# Patient Record
Sex: Female | Born: 2005 | Race: Black or African American | Hispanic: No | Marital: Single | Smoking: Never smoker
Health system: Southern US, Community
[De-identification: ages and names within clinical notes are randomized; demographics above are authoritative.]

## PROBLEM LIST (undated history)

## (undated) DIAGNOSIS — J45909 Unspecified asthma, uncomplicated: Secondary | ICD-10-CM

## (undated) HISTORY — PX: NO PAST SURGERIES: SHX2092

---
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
## 2024-01-07 ENCOUNTER — Ambulatory Visit: Admitting: Obstetrics and Gynecology

## 2024-01-07 NOTE — Progress Notes (Deleted)
    GYNECOLOGY VISIT  Patient name: Anna Keith MRN [id number]  Date of birth: 26-Jun-2006 Chief Complaint:   No chief complaint on file.   History:  Anna Keith is a 18 y.o. G0P0000 being seen today for ***.    Seen by me 12/06/23: seen for AUB and acne. Switched to micronor  from slynd. VWF labs slightly abnormal, recommended repeat  Past Medical History:  Diagnosis Date   Asthma     Past Surgical History:  Procedure Laterality Date   NO PAST SURGERIES      The following portions of the patient's history were reviewed and updated as appropriate: allergies, current medications, past family history, past medical history, past social history, past surgical history and problem list.   Health Maintenance:   Last pap ***. Results were: {Pap findings:25134}. H/O abnormal pap: {yes/yes***/no:23866} Last mammogram: ***. Results were: {normal, abnormal, n/a:23837}. Family h/o breast cancer: {yes***/no:23838}   Review of Systems:  {Ros - complete:30496} Comprehensive review of systems was otherwise negative.   Objective:  Physical Exam There were no vitals taken for this visit.   Physical Exam   Labs and Imaging No results found.     Assessment & Plan:   There are no diagnoses linked to this encounter.   *** Routine preventative health maintenance measures emphasized.  Kiki Pelton, MD [facility] for [facility], [facility]

## 2024-01-16 ENCOUNTER — Ambulatory Visit: Admitting: Family Medicine

## 2024-03-07 ENCOUNTER — Telehealth: Admitting: Nurse Practitioner

## 2024-03-07 DIAGNOSIS — H66004 Acute suppurative otitis media without spontaneous rupture of ear drum, recurrent, right ear: Secondary | ICD-10-CM

## 2024-03-07 MED ORDER — CEFDINIR 300 MG PO CAPS: 300.0000 mg | ORAL_CAPSULE | Freq: Two times a day (BID) | ORAL | 0 refills | Status: AC

## 2024-03-07 MED ORDER — IBUPROFEN 600 MG PO TABS: 600.0000 mg | ORAL_TABLET | Freq: Three times a day (TID) | ORAL | 0 refills | Status: DC | PRN

## 2024-03-07 NOTE — Patient Instructions (Signed)
  Anna Keith, thank you for joining Haze LELON Servant, NP for today's virtual visit.  While this provider is not your primary care provider (PCP), if your PCP is located in our provider database this encounter information will be shared with them immediately following your visit.   A [facility] MyChart account gives you access to today's visit and all your visits, tests, and labs performed at [facility]  click here if you don't have a [facility] MyChart account or go to [email or web address]  Consent: (Patient) Anna Keith provided verbal consent for this virtual visit at the beginning of the encounter.  Current Medications:  Current Outpatient Medications:    cefdinir  (OMNICEF ) 300 MG capsule, Take 1 capsule (300 mg total) by mouth 2 (two) times daily for 7 days., Disp: 14 capsule, Rfl: 0   ibuprofen  (ADVIL ) 600 MG tablet, Take 1 tablet (600 mg total) by mouth every 8 (eight) hours as needed., Disp: 30 tablet, Rfl: 0   neomycin -polymyxin-hydrocortisone  (CORTISPORIN) OTIC solution, Place 3 drops into the left ear 4 (four) times daily., Disp: 10 mL, Rfl: 0   norethindrone  (MICRONOR ) 0.35 MG tablet, Take 1 tablet (0.35 mg total) by mouth daily., Disp: 28 tablet, Rfl: 11   Medications ordered in this encounter:  Meds ordered this encounter  Medications   cefdinir  (OMNICEF ) 300 MG capsule    Sig: Take 1 capsule (300 mg total) by mouth 2 (two) times daily for 7 days.    Dispense:  14 capsule    Refill:  0    Supervising Provider:   BLAISE ALEENE KIDD [[id number]]   ibuprofen  (ADVIL ) 600 MG tablet    Sig: Take 1 tablet (600 mg total) by mouth every 8 (eight) hours as needed.    Dispense:  30 tablet    Refill:  0    Supervising Provider:   LAMPTEY, PHILIP O [[id number]]     *If you need refills on other medications prior to your next appointment, please contact your pharmacy*  Follow-Up: Call back or seek an in-person evaluation if the symptoms worsen or if the  condition fails to improve as anticipated.  [facility] [phone]    If you have been instructed to have an in-person evaluation today at a local Urgent Care facility, please use the link below. It will take you to a list of all of our available [facility], including address, phone number and hours of operation. Please do not delay care.  [facility]  If you or a family member do not have a primary care provider, use the link below to schedule a visit and establish care. When you choose a [facility] primary care physician or advanced practice provider, you gain a long-term partner in health. Find a Primary Care Provider  Learn more about [facility]'s in-office and virtual care options:  - Get Care Now

## 2024-03-07 NOTE — Progress Notes (Signed)
 Virtual Visit Consent   Joline Encalada Maske, you are scheduled for a virtual visit with a [facility] provider today. Just as with appointments in the office, your consent must be obtained to participate. Your consent will be active for this visit and any virtual visit you may have with one of our providers in the next 365 days. If you have a MyChart account, a copy of this consent can be sent to you electronically.  As this is a virtual visit, video technology does not allow for your provider to perform a traditional examination. This may limit your provider's ability to fully assess your condition. If your provider identifies any concerns that need to be evaluated in person or the need to arrange testing (such as labs, EKG, etc.), we will make arrangements to do so. Although advances in technology are sophisticated, we cannot ensure that it will always work on either your end or our end. If the connection with a video visit is poor, the visit may have to be switched to a telephone visit. With either a video or telephone visit, we are not always able to ensure that we have a secure connection.  By engaging in this virtual visit, you consent to the provision of healthcare and authorize for your insurance to be billed (if applicable) for the services provided during this visit. Depending on your insurance coverage, you may receive a charge related to this service.  I need to obtain your verbal consent now. Are you willing to proceed with your visit today? Anna Keith has provided verbal consent on 03/07/2024 for a virtual visit (video or telephone). Haze LELON Servant, NP  Date: 03/07/2024 6:34 PM   Virtual Visit via Video Note   I, Haze LELON Servant, connected with  Anna Keith  ([id number], [birth year]) on 03/07/24 at  6:30 PM EDT by a video-enabled telemedicine application and verified that I am speaking with the correct person using two identifiers.  Location: Patient: Virtual Visit  Location Patient: Home Provider: Virtual Visit Location Provider: Home Office   I discussed the limitations of evaluation and management by telemedicine and the availability of in person appointments. The patient expressed understanding and agreed to proceed.    History of Present Illness: Anna Keith is a 18 y.o. who identifies as a female who was assigned female at birth, and is being seen today for right ear pain.  Ms Pile has been experiencing severe right ear pain with loss of hearing. She has a history of recurring supprative otitis media for which she has been prescribed several ear drops and other oral abx. She has not seen ENT for this. She does not have a PCP  Problems:  Patient Active Problem List   Diagnosis Date Noted   Elevated blood pressure reading 06/17/2021    Allergies: No Known Allergies Medications:  Current Outpatient Medications:    cefdinir  (OMNICEF ) 300 MG capsule, Take 1 capsule (300 mg total) by mouth 2 (two) times daily for 7 days., Disp: 14 capsule, Rfl: 0   ibuprofen  (ADVIL ) 600 MG tablet, Take 1 tablet (600 mg total) by mouth every 8 (eight) hours as needed., Disp: 30 tablet, Rfl: 0   neomycin -polymyxin-hydrocortisone  (CORTISPORIN) OTIC solution, Place 3 drops into the left ear 4 (four) times daily., Disp: 10 mL, Rfl: 0   norethindrone  (MICRONOR ) 0.35 MG tablet, Take 1 tablet (0.35 mg total) by mouth daily., Disp: 28 tablet, Rfl: 11  Observations/Objective: Patient is well-developed, well-nourished in no acute distress.  Resting comfortably at home.  Head is normocephalic, atraumatic.  No labored breathing.  Speech is clear and coherent with logical content.  Patient is alert and oriented at baseline.    Assessment and Plan: 1. Recurrent acute suppurative otitis media of right ear without spontaneous rupture of tympanic membrane (Primary) - cefdinir  (OMNICEF ) 300 MG capsule; Take 1 capsule (300 mg total) by mouth 2 (two) times daily for 7  days.  Dispense: 14 capsule; Refill: 0 - ibuprofen  (ADVIL ) 600 MG tablet; Take 1 tablet (600 mg total) by mouth every 8 (eight) hours as needed.  Dispense: 30 tablet; Refill: 0    Follow Up Instructions: I discussed the assessment and treatment plan with the patient. The patient was provided an opportunity to ask questions and all were answered. The patient agreed with the plan and demonstrated an understanding of the instructions.  A copy of instructions were sent to the patient via MyChart unless otherwise noted below.     The patient was advised to call back or seek an in-person evaluation if the symptoms worsen or if the condition fails to improve as anticipated.    Kenniya Westrich W Selena Swaminathan, NP

## 2024-03-19 ENCOUNTER — Ambulatory Visit
Admission: EM | Admit: 2024-03-19 | Discharge: 2024-03-19 | Disposition: A | Discharge status: Home / Self Care | Attending: Physician Assistant | Admitting: Physician Assistant

## 2024-03-19 ENCOUNTER — Ambulatory Visit

## 2024-03-19 ENCOUNTER — Ambulatory Visit
Admission: RE | Admit: 2024-03-19 | Discharge: 2024-03-19 | Disposition: A | Discharge status: Home / Self Care | Source: Ambulatory Visit

## 2024-03-19 DIAGNOSIS — H60501 Unspecified acute noninfective otitis externa, right ear: Secondary | ICD-10-CM

## 2024-03-19 MED ORDER — CIPROFLOXACIN-DEXAMETHASONE 0.3-0.1 % OT SUSP
4.0000 [drp] | Freq: Two times a day (BID) | OTIC | 0 refills | Status: AC
Start: 2024-03-19 — End: 2024-03-26

## 2024-03-19 NOTE — ED Provider Notes (Signed)
 [facility]    CSN: [id number] Arrival date & time: 03/19/24  1800      History   Chief Complaint Chief Complaint  Patient presents with   Otalgia    HPI Anna Keith is a 18 y.o. female.   Patient today for evaluation of right ear pain that started recently.  She reports that she had a virtual visit 12 days ago but did not pick up the medication as symptoms improved.  She reports that after that visit she went swimming and now symptoms have returned.  She denies any pain in the left ear.  She has had no other symptoms.  The history is provided by the patient.  Otalgia Associated symptoms: no abdominal pain, no congestion, no cough, no fever, no sore throat and no vomiting     Past Medical History:  Diagnosis Date   Asthma     Patient Active Problem List   Diagnosis Date Noted   Elevated blood pressure reading 06/17/2021    Past Surgical History:  Procedure Laterality Date   NO PAST SURGERIES      OB History     Gravida  0   Para  0   Term  0   Preterm  0   AB  0   Living  0      SAB  0   IAB  0   Ectopic  0   Multiple  0   Live Births  0            Home Medications    Prior to Admission medications   Medication Sig Start Date End Date Taking? Authorizing Provider  ciprofloxacin -dexamethasone  (CIPRODEX ) OTIC suspension Place 4 drops into the right ear 2 (two) times daily for 7 days. 03/19/24 03/26/24 Yes Billy Asberry FALCON, PA-C  norethindrone  (MICRONOR ) 0.35 MG tablet Take 1 tablet (0.35 mg total) by mouth daily. 12/06/23  Yes Ajewole, Christana, MD  ibuprofen  (ADVIL ) 600 MG tablet Take 1 tablet (600 mg total) by mouth every 8 (eight) hours as needed. 03/07/24   Theotis Haze ORN, NP    Family History History reviewed. No pertinent family history.  Social History Social History   Tobacco Use   Smoking status: Never    Passive exposure: Current   Smokeless tobacco: Never  Vaping Use   Vaping status: Never Used   Substance Use Topics   Alcohol use: Never   Drug use: Never     Allergies   Patient has no known allergies.   Review of Systems Review of Systems  Constitutional:  Negative for chills and fever.  HENT:  Positive for ear pain. Negative for congestion and sore throat.   Eyes:  Negative for discharge and redness.  Respiratory:  Negative for cough and shortness of breath.   Gastrointestinal:  Negative for abdominal pain, nausea and vomiting.     Physical Exam Triage Vital Signs ED Triage Vitals  Encounter Vitals Group     BP 03/19/24 1814 139/83     Girls Systolic BP Percentile --      Girls Diastolic BP Percentile --      Boys Systolic BP Percentile --      Boys Diastolic BP Percentile --      Pulse Rate 03/19/24 1814 80     Resp 03/19/24 1814 20     Temp 03/19/24 1814 98.8 F (37.1 C)     Temp Source 03/19/24 1814 Oral     SpO2 03/19/24 1814 97 %  Weight 03/19/24 1812 300 lb (136.1 kg)     Height 03/19/24 1812 5' 8 (1.727 m)     Head Circumference --      Peak Flow --      Pain Score 03/19/24 1808 8     Pain Loc --      Pain Education --      Exclude from Growth Chart --    No data found.  Updated Vital Signs BP 139/83 (BP Location: Left Arm)   Pulse 80   Temp 98.8 F (37.1 C) (Oral)   Resp 20   Ht 5' 8 (1.727 m)   Wt 300 lb (136.1 kg)   LMP  (LMP Unknown)   SpO2 97%   BMI 45.61 kg/m   Visual Acuity Right Eye Distance:   Left Eye Distance:   Bilateral Distance:    Right Eye Near:   Left Eye Near:    Bilateral Near:     Physical Exam Vitals and nursing note reviewed.  Constitutional:      General: She is not in acute distress.    Appearance: Normal appearance. She is not ill-appearing.  HENT:     Head: Normocephalic and atraumatic.     Left Ear: There is impacted cerumen.     Ears:     Comments: Right EAC erythematous with purulent exudate noted, right tragal movement tenderness noted    Nose: Nose normal. No congestion.  Eyes:      Conjunctiva/sclera: Conjunctivae normal.  Cardiovascular:     Rate and Rhythm: Normal rate.  Pulmonary:     Effort: Pulmonary effort is normal. No respiratory distress.  Neurological:     Mental Status: She is alert.  Psychiatric:        Mood and Affect: Mood normal.        Behavior: Behavior normal.        Thought Content: Thought content normal.      UC Treatments / Results  Labs (all labs ordered are listed, but only abnormal results are displayed) Labs Reviewed - No data to display  EKG   Radiology No results found.  Procedures Procedures (including critical care time)  Medications Ordered in UC Medications - No data to display  Initial Impression / Assessment and Plan / UC Course  I have reviewed the triage vital signs and the nursing notes.  Pertinent labs & imaging results that were available during my care of the patient were reviewed by me and considered in my medical decision making (see chart for details).    Will treat to cover otitis externa with Ciprodex  drops.  Advise follow-up if no gradual improvement with any further concerns.  Final Clinical Impressions(s) / UC Diagnoses   Final diagnoses:  Acute otitis externa of right ear, unspecified type   Discharge Instructions   None    ED Prescriptions     Medication Sig Dispense Auth. Provider   ciprofloxacin -dexamethasone  (CIPRODEX ) OTIC suspension Place 4 drops into the right ear 2 (two) times daily for 7 days. 7.5 mL Billy Asberry FALCON, PA-C      PDMP not reviewed this encounter.   Billy Asberry FALCON, PA-C 03/19/24 1907

## 2024-03-19 NOTE — ED Triage Notes (Signed)
 I am having right ear pain, I had a virtual visit on 03-07-2024 and the right ear got better right after that visit so I didn't pick up the medication. I went swimming some time later and now the right ear is hurting again. No fever.

## 2024-04-09 ENCOUNTER — Encounter (HOSPITAL_COMMUNITY): Payer: Self-pay | Admitting: Emergency Medicine

## 2024-04-09 ENCOUNTER — Other Ambulatory Visit: Payer: Self-pay

## 2024-04-09 ENCOUNTER — Emergency Department (HOSPITAL_COMMUNITY)
Admission: EM | Admit: 2024-04-09 | Discharge: 2024-04-10 | Disposition: A | Discharge status: Home / Self Care | Attending: Emergency Medicine | Admitting: Emergency Medicine

## 2024-04-09 DIAGNOSIS — J45909 Unspecified asthma, uncomplicated: Secondary | ICD-10-CM | POA: Diagnosis not present

## 2024-04-09 DIAGNOSIS — R3129 Other microscopic hematuria: Secondary | ICD-10-CM | POA: Insufficient documentation

## 2024-04-09 DIAGNOSIS — M7989 Other specified soft tissue disorders: Secondary | ICD-10-CM | POA: Diagnosis present

## 2024-04-09 DIAGNOSIS — R600 Localized edema: Secondary | ICD-10-CM | POA: Diagnosis not present

## 2024-04-09 LAB — CBC WITH DIFFERENTIAL/PLATELET
Abs Immature Granulocytes: 0.01 K/uL (ref 0.00–0.07)
Basophils Absolute: 0 K/uL (ref 0.0–0.1)
Basophils Relative: 0 %
Eosinophils Absolute: 0.1 K/uL (ref 0.0–0.5)
Eosinophils Relative: 1 %
HCT: 38.7 % (ref 36.0–46.0)
Hemoglobin: 11.9 g/dL — ABNORMAL LOW (ref 12.0–15.0)
Immature Granulocytes: 0 %
Lymphocytes Relative: 42 %
Lymphs Abs: 3.6 K/uL (ref 0.7–4.0)
MCH: 26.1 pg (ref 26.0–34.0)
MCHC: 30.7 g/dL (ref 30.0–36.0)
MCV: 84.9 fL (ref 80.0–100.0)
Monocytes Absolute: 0.7 K/uL (ref 0.1–1.0)
Monocytes Relative: 8 %
Neutro Abs: 4.3 K/uL (ref 1.7–7.7)
Neutrophils Relative %: 49 %
Platelets: 314 K/uL (ref 150–400)
RBC: 4.56 MIL/uL (ref 3.87–5.11)
RDW: 13.6 % (ref 11.5–15.5)
WBC: 8.8 K/uL (ref 4.0–10.5)
nRBC: 0 % (ref 0.0–0.2)

## 2024-04-09 NOTE — ED Triage Notes (Signed)
 Patient c/o bilateral non pitting edema on foot and ankles started this morning. Patient denies any foot or leg pain. Patient denies SOB and chest pain.

## 2024-04-09 NOTE — ED Provider Notes (Signed)
 [name] AT [facility] Provider Note   CSN: [id number] Arrival date & time: 04/09/24  2204     Patient presents with: Joint Swelling   Anna Keith is a 18 y.o. female.   18 year old female brought in by mom with complaint of ankle swelling. Patient states she injured her left ankle 07/2020, mom reports she was not compliant with crutches/PT, has continued to work. Patient states it is normal for her left ankle to swell from time to time but feels like it is more swollen today and now swelling in to the foot as well as mild swelling in the right ankle.  History of heavy menstrual cycles, negative/borderline von willebrand work up earlier this year. Non smoker, no recent extended travel, no personal or close relative history of PE/DVT (states an aunt in the family had a PE), no SHOB/CP. Is on OCP.        Prior to Admission medications   Medication Sig Start Date End Date Taking? Authorizing Provider  norethindrone (MICRONOR) 0.35 MG tablet Take 1 tablet (0.35 mg total) by mouth daily. 12/06/23  Yes Anna Keith, Christana, MD  ibuprofen (ADVIL) 600 MG tablet Take 1 tablet (600 mg total) by mouth every 8 (eight) hours as needed. Patient not taking: Reported on 04/10/2024 03/07/24   Anna Keith    Allergies: Patient has no known allergies.    Review of Systems Negative except as per HPI Updated Vital Signs BP (!) 143/75   Pulse 78   Temp 98 F (36.7 C) (Oral)   Resp 19   LMP  (LMP Unknown)   SpO2 100%   Physical Exam Vitals and nursing note reviewed.  Constitutional:      General: She is not in acute distress.    Appearance: She is well-developed. She is obese. She is not diaphoretic.  HENT:     Head: Normocephalic and atraumatic.  Cardiovascular:     Pulses: Normal pulses.  Pulmonary:     Effort: Pulmonary effort is normal.  Musculoskeletal:        General: Tenderness present.     Right lower leg: Edema present.     Left  lower leg: Edema present.     Comments: Non pitting edema to bilateral lower extremities, left more so than right. No calf tenderness, no palpable cords, no erythema, -homans   Skin:    General: Skin is warm and dry.     Findings: No erythema or rash.  Neurological:     Mental Status: She is alert and oriented to person, place, and time.     Sensory: No sensory deficit.     Motor: No weakness.     Gait: Gait normal.  Psychiatric:        Behavior: Behavior normal.     (all labs ordered are listed, but only abnormal results are displayed) Labs Reviewed  CBC WITH DIFFERENTIAL/PLATELET - Abnormal; Notable for the following components:      Result Value   Hemoglobin 11.9 (*)    All other components within normal limits  URINALYSIS, ROUTINE W REFLEX MICROSCOPIC - Abnormal; Notable for the following components:   APPearance HAZY (*)    Hgb urine dipstick MODERATE (*)    All other components within normal limits  BASIC METABOLIC PANEL WITH GFR  D-DIMER, QUANTITATIVE  PREGNANCY, URINE    EKG: None  Radiology: No results found.   Procedures   Medications Ordered in the ED - No data  to display  Clinical Course as of 04/10/24 0202  Thu Apr 09, 2024  2222 BP 155/85 [LM]    Clinical Course User Index [LM] Anna Leita LABOR, PA-C                                 Medical Decision Making Amount and/or Complexity of Data Reviewed Labs: ordered.   This patient presents to the ED for concern of lower extremity edema, this involves an extensive number of treatment options, and is a complaint that carries with it a high risk of complications and morbidity.  The differential diagnosis includes DVT, electrolyte/metabolic, CHF, vascular disease   Co morbidities / Chronic conditions that complicate the patient evaluation  Obesity, heavy menstrual cycles, asthma   Additional history obtained:  Additional history obtained from EMR Mom at bedside who assists with history as  above External records from outside source obtained and reviewed including prior labs on file    Lab Tests:  I Ordered, and personally interpreted labs.  The pertinent results include: CBC without significant findings.  BMP within the limits.  D-dimer is negative.  Urinalysis with moderate hemoglobin, possible contaminant, discussed with patient to monitor and recheck with PCP.   Problem List / ED Course / Critical interventions / Medication management  18 year old female presents with mom with concern for lower extremity edema as above.  Workup reassuring.  Incidental microscopic hematuria.  Patient to monitor blood pressure, elevate legs, monitor salt intake, wear compression hose and recheck with PCP. I have reviewed the patients home medicines and have made adjustments as needed  Social Determinants of Health:  Has PCP, is scheduled to be seen 04/15/2024   Test / Admission - Considered:  Stable for discharge      Final diagnoses:  Bilateral lower extremity edema  Other microscopic hematuria    ED Discharge Orders     None          Anna Keith Anna Keith 04/10/24 0202    Anna Norris, MD 04/10/24 [phone]

## 2024-04-10 LAB — BASIC METABOLIC PANEL WITH GFR
Anion gap: 7 (ref 5–15)
BUN: 15 mg/dL (ref 6–20)
CO2: 26 mmol/L (ref 22–32)
Calcium: 9.3 mg/dL (ref 8.9–10.3)
Chloride: 107 mmol/L (ref 98–111)
Creatinine, Ser: 0.95 mg/dL (ref 0.44–1.00)
GFR, Estimated: >60 mL/min (ref >60)
Glucose, Bld: 90 mg/dL (ref 70–99)
Potassium: 3.6 mmol/L (ref 3.5–5.1)
Sodium: 140 mmol/L (ref 135–145)

## 2024-04-10 LAB — URINALYSIS, ROUTINE W REFLEX MICROSCOPIC
Bacteria, UA: NONE SEEN
Bilirubin Urine: NEGATIVE
Glucose, UA: NEGATIVE mg/dL
Ketones, ur: NEGATIVE mg/dL
Leukocytes,Ua: NEGATIVE
Nitrite: NEGATIVE
Protein, ur: NEGATIVE mg/dL
RBC / HPF: >50 RBC/hpf (ref 0–5)
Specific Gravity, Urine: 1.029 (ref 1.005–1.030)
pH: 5 (ref 5.0–8.0)

## 2024-04-10 LAB — D-DIMER, QUANTITATIVE: D-Dimer, Quant: 0.37 ug{FEU}/mL (ref 0.00–0.50)

## 2024-04-10 LAB — PREGNANCY, URINE: Preg Test, Ur: NEGATIVE

## 2024-04-10 NOTE — Discharge Instructions (Addendum)
 Monitor salt intake.  Elevate legs when possible.  Wear compression hose.  Is easiest to put your compression hose on the bedside table and apply first thing in the morning before putting your legs out of bed.  Follow-up with your primary care provider as scheduled.  Return to the emergency room for worsening or concerning symptoms.  Monitor your blood pressure, discussed with your primary care doctor if your blood pressure is staying higher than 120/80.

## 2024-05-17 ENCOUNTER — Telehealth: Admitting: Family

## 2024-05-17 DIAGNOSIS — H6991 Unspecified Eustachian tube disorder, right ear: Secondary | ICD-10-CM | POA: Diagnosis not present

## 2024-05-17 MED ORDER — CETIRIZINE HCL 10 MG PO TABS: 10.0000 mg | ORAL_TABLET | Freq: Every day | ORAL | 1 refills | Status: AC

## 2024-05-17 MED ORDER — FLUTICASONE PROPIONATE 50 MCG/ACT NA SUSP: 2.0000 | Freq: Every day | NASAL | 6 refills | Status: AC

## 2024-05-17 NOTE — Progress Notes (Signed)
 Virtual Visit Consent   Anna Keith, you are scheduled for a virtual visit with a [facility] provider today. Just as with appointments in the office, your consent must be obtained to participate. Your consent will be active for this visit and any virtual visit you may have with one of our providers in the next 365 days. If you have a MyChart account, a copy of this consent can be sent to you electronically.  As this is a virtual visit, video technology does not allow for your provider to perform a traditional examination. This may limit your provider's ability to fully assess your condition. If your provider identifies any concerns that need to be evaluated in person or the need to arrange testing (such as labs, EKG, etc.), we will make arrangements to do so. Although advances in technology are sophisticated, we cannot ensure that it will always work on either your end or our end. If the connection with a video visit is poor, the visit may have to be switched to a telephone visit. With either a video or telephone visit, we are not always able to ensure that we have a secure connection.  By engaging in this virtual visit, you consent to the provision of healthcare and authorize for your insurance to be billed (if applicable) for the services provided during this visit. Depending on your insurance coverage, you may receive a charge related to this service.  I need to obtain your verbal consent now. Are you willing to proceed with your visit today? Anna Keith has provided verbal consent on 05/17/2024 for a virtual visit (video or telephone). Bari Learn, FNP  Date: 05/17/2024 6:04 PM   Virtual Visit via Video Note   I, Bari Learn, connected with  Anna Keith  ([id number], [birth year]) on 05/17/24 at  6:00 PM EDT by a video-enabled telemedicine application and verified that I am speaking with the correct person using two identifiers.  Location: Patient: Virtual Visit Location  Patient: Home Provider: Virtual Visit Location Provider: Home Office   I discussed the limitations of evaluation and management by telemedicine and the availability of in person appointments. The patient expressed understanding and agreed to proceed.    History of Present Illness: Anna Keith is a 18 y.o. who identifies as a female who was assigned female at birth, and is being seen today for right ear fullness.  HPI: Ear Fullness  There is pain in the right ear. This is a recurrent problem. The current episode started in the past 7 days. There has been no fever. The patient is experiencing no pain. Associated symptoms include hearing loss. Pertinent negatives include no coughing, diarrhea, ear discharge, headaches, rhinorrhea, sore throat or vomiting. She has tried acetaminophen  for the symptoms. The treatment provided mild relief.    Problems:  Patient Active Problem List   Diagnosis Date Noted   Elevated blood pressure reading 06/17/2021    Allergies: No Known Allergies Medications:  Current Outpatient Medications:    cetirizine  (ZYRTEC  ALLERGY) 10 MG tablet, Take 1 tablet (10 mg total) by mouth daily., Disp: 90 tablet, Rfl: 1   fluticasone  (FLONASE ) 50 MCG/ACT nasal spray, Place 2 sprays into both nostrils daily., Disp: 16 g, Rfl: 6   NIFEdipine (ADALAT CC) 30 MG 24 hr tablet, Take 30 mg by mouth., Disp: , Rfl:    Vitamin D, Ergocalciferol, (DRISDOL) 1.25 MG (50000 UNIT) CAPS capsule, Take 50,000 Units by mouth., Disp: , Rfl:    norethindrone  (MICRONOR ) 0.35 MG  tablet, Take 1 tablet (0.35 mg total) by mouth daily., Disp: 28 tablet, Rfl: 11  Observations/Objective: Patient is well-developed, well-nourished in no acute distress.  Resting comfortably  at home.  Head is normocephalic, atraumatic.  No labored breathing.  Speech is clear and coherent with logical content.  Patient is alert and oriented at baseline.  No pain present in right ear  Assessment and Plan: 1.  Eustachian tube disorder, right (Primary) - fluticasone  (FLONASE ) 50 MCG/ACT nasal spray; Place 2 sprays into both nostrils daily.  Dispense: 16 g; Refill: 6 - cetirizine  (ZYRTEC  ALLERGY) 10 MG tablet; Take 1 tablet (10 mg total) by mouth daily.  Dispense: 90 tablet; Refill: 1  Start zyrtec , flonase , and nasal decongestant  Recommend ENT referral since this is recurrent Force fluids Follow up if symptoms worsen or do not improve   Follow Up Instructions: I discussed the assessment and treatment plan with the patient. The patient was provided an opportunity to ask questions and all were answered. The patient agreed with the plan and demonstrated an understanding of the instructions.  A copy of instructions were sent to the patient via MyChart unless otherwise noted below.     The patient was advised to call back or seek an in-person evaluation if the symptoms worsen or if the condition fails to improve as anticipated.    Bari Learn, FNP

## 2024-05-17 NOTE — Patient Instructions (Signed)
 Eustachian Tube Dysfunction  Eustachian tube dysfunction refers to a condition in which a blockage develops in the narrow passage that connects the middle ear to the back of the nose (eustachian tube). The eustachian tube regulates air pressure in the middle ear by letting air move between the ear and nose. It also helps to drain fluid from the middle ear space. Eustachian tube dysfunction can affect one or both ears. When the eustachian tube does not function properly, air pressure, fluid, or both can build up in the middle ear. What are the causes? This condition occurs when the eustachian tube becomes blocked or cannot open normally. Common causes of this condition include: Ear infections. Colds and other infections that affect the nose, mouth, and throat (upper respiratory tract). Allergies. Irritation from cigarette smoke. Irritation from stomach acid coming up into the esophagus (gastroesophageal reflux). The esophagus is the part of the body that moves food from the mouth to the stomach. Sudden changes in air pressure, such as from descending in an airplane or scuba diving. Abnormal growths in the nose or throat, such as: Growths that line the nose (nasal polyps). Abnormal growth of cells (tumors). Enlarged tissue at the back of the throat (adenoids). What increases the risk? You are more likely to develop this condition if: You smoke. You are overweight. You are a child who has: Certain birth defects of the mouth, such as cleft palate. Large tonsils or adenoids. What are the signs or symptoms? Common symptoms of this condition include: A feeling of fullness in the ear. Ear pain. Clicking or popping noises in the ear. Ringing in the ear (tinnitus). Hearing loss. Loss of balance. Dizziness. Symptoms may get worse when the air pressure around you changes, such as when you travel to an area of high elevation, fly on an airplane, or go scuba diving. How is this diagnosed? This  condition may be diagnosed based on: Your symptoms. A physical exam of your ears, nose, and throat. Tests, such as those that measure: The movement of your eardrum. Your hearing (audiometry). How is this treated? Treatment depends on the cause and severity of your condition. In mild cases, you may relieve your symptoms by moving air into your ears. This is called "popping the ears." In more severe cases, or if you have symptoms of fluid in your ears, treatment may include: Medicines to relieve congestion (decongestants). Medicines that treat allergies (antihistamines). Nasal sprays or ear drops that contain medicines that reduce swelling (steroids). A procedure to drain the fluid in your eardrum. In this procedure, a small tube may be placed in the eardrum to: Drain the fluid. Restore the air in the middle ear space. A procedure to insert a balloon device through the nose to inflate the opening of the eustachian tube (balloon dilation). Follow these instructions at home: Lifestyle Do not do any of the following until your health care provider approves: Travel to high altitudes. Fly in airplanes. Work in a Estate agent or room. Scuba dive. Do not use any products that contain nicotine or tobacco. These products include cigarettes, chewing tobacco, and vaping devices, such as e-cigarettes. If you need help quitting, ask your health care provider. Keep your ears dry. Wear fitted earplugs during showering and bathing. Dry your ears completely after. General instructions Take over-the-counter and prescription medicines only as told by your health care provider. Use techniques to help pop your ears as recommended by your health care provider. These may include: Chewing gum. Yawning. Frequent, forceful swallowing.  Closing your mouth, holding your nose closed, and gently blowing as if you are trying to blow air out of your nose. Keep all follow-up visits. This is important. Contact a  health care provider if: Your symptoms do not go away after treatment. Your symptoms come back after treatment. You are unable to pop your ears. You have: A fever. Pain in your ear. Pain in your head or neck. Fluid draining from your ear. Your hearing suddenly changes. You become very dizzy. You lose your balance. Get help right away if: You have a sudden, severe increase in any of your symptoms. Summary Eustachian tube dysfunction refers to a condition in which a blockage develops in the eustachian tube. It can be caused by ear infections, allergies, inhaled irritants, or abnormal growths in the nose or throat. Symptoms may include ear pain or fullness, hearing loss, or ringing in the ears. Mild cases are treated with techniques to unblock the ears, such as yawning or chewing gum. More severe cases are treated with medicines or procedures. This information is not intended to replace advice given to you by your health care provider. Make sure you discuss any questions you have with your health care provider. Document Revised: 10/24/2020 Document Reviewed: 10/24/2020 Elsevier Patient Education  2024 [employer].
